# Patient Record
Sex: Male | Born: 2000 | Race: White | Hispanic: No | Marital: Single | Smoking: Never smoker
Health system: Southern US, Community
[De-identification: ages and names within clinical notes are randomized; demographics above are authoritative.]

## PROBLEM LIST (undated history)

## (undated) DIAGNOSIS — J45909 Unspecified asthma, uncomplicated: Secondary | ICD-10-CM

---
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
## 2019-01-11 IMAGING — CT CT ABD-PELV W/ CM
2 of 4 series · 16 of 46 positions shown, 18 images · IV contrast (iopamidol)
Comparison: None.

CLINICAL DATA: Five day history of right-sided abdominal pain

EXAM:
CT ABDOMEN AND PELVIS WITH CONTRAST
TECHNIQUE: Multidetector CT imaging of the abdomen and pelvis was performed
using the standard protocol following bolus administration of
intravenous contrast.
CONTRAST:  100mL E8YNIK-HOO IOPAMIDOL (E8YNIK-HOO) INJECTION 61%

[Series 2: routine abd/pel with · axial · 0.70mm/px · z∈[-863,-478]mm · 13 of 85 slices shown, 15 images]
[im 4/85  soft-tissue]
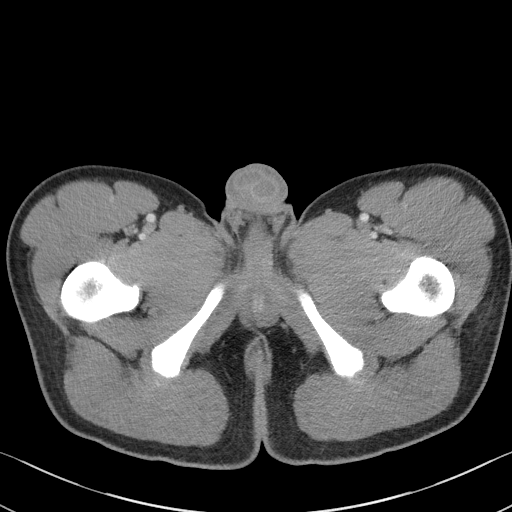
[im 4/85  bone]
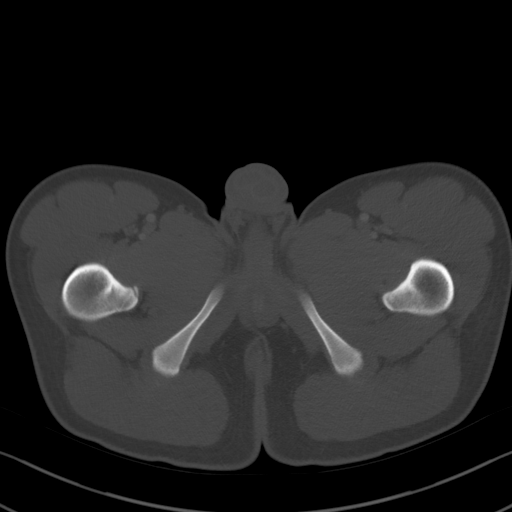
[im 11/85  soft-tissue]
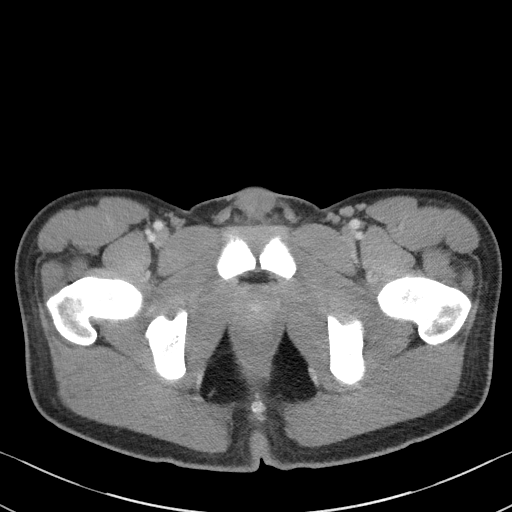
[im 17/85  soft-tissue]
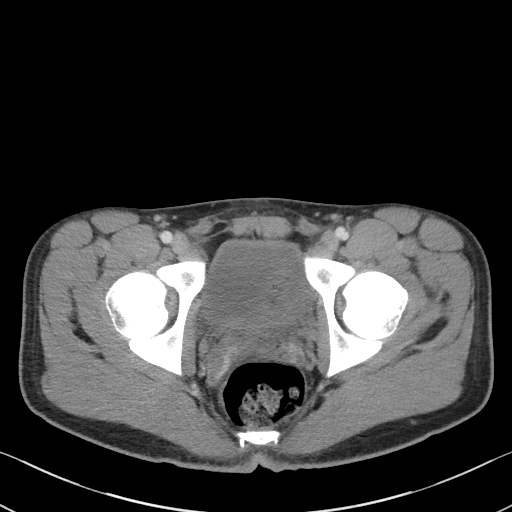
[im 24/85  soft-tissue]
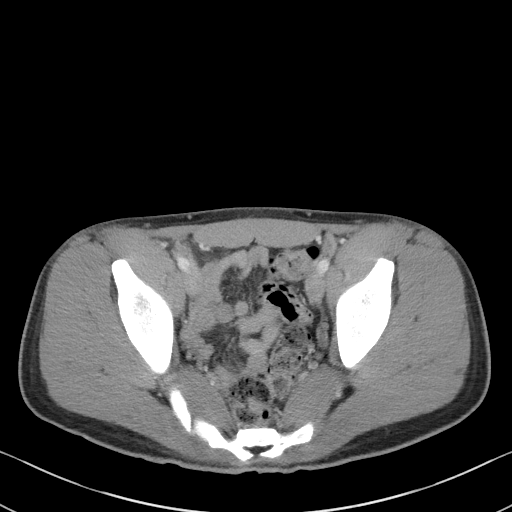
[im 31/85  soft-tissue]
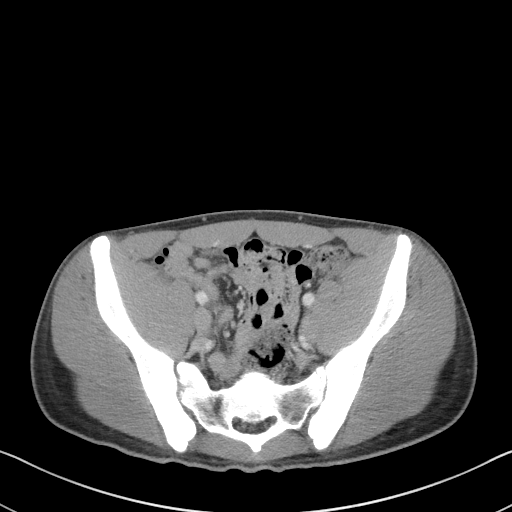
[im 37/85  soft-tissue]
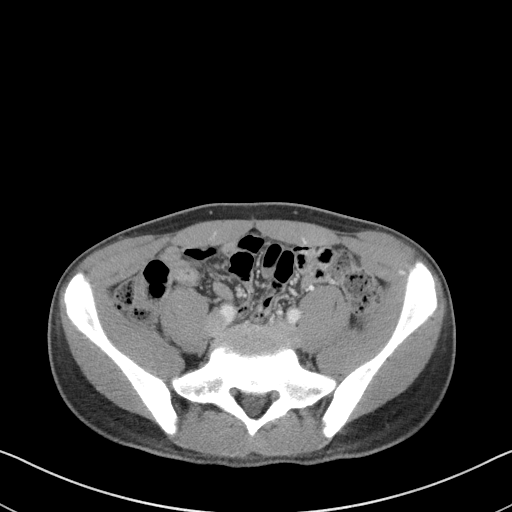
[im 44/85  soft-tissue]
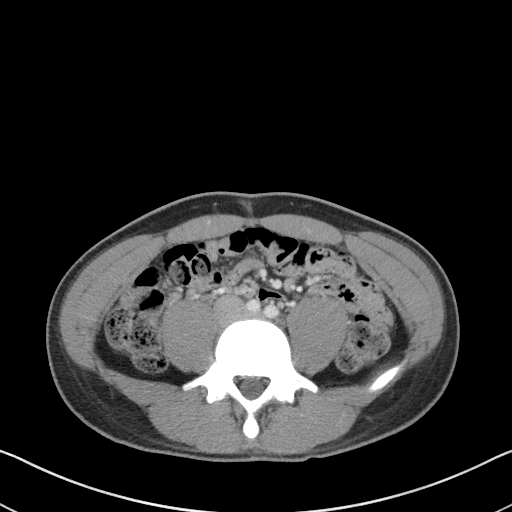
[im 48/85  soft-tissue]
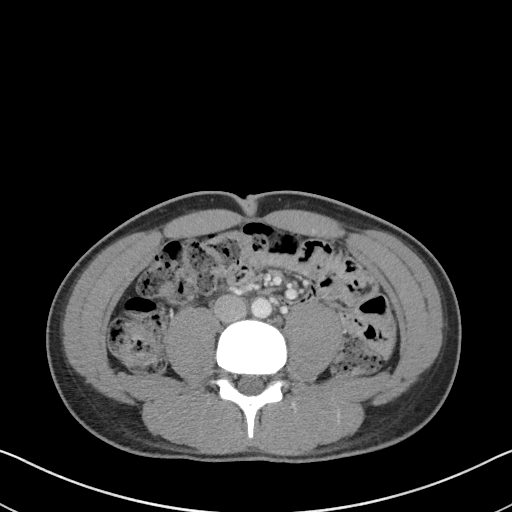
[im 54/85  soft-tissue]
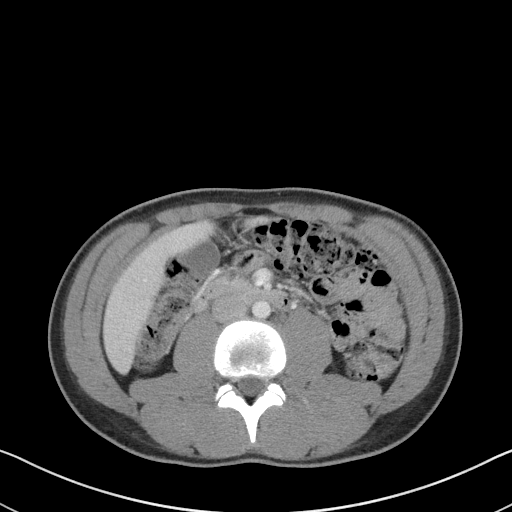
[im 54/85  bone]
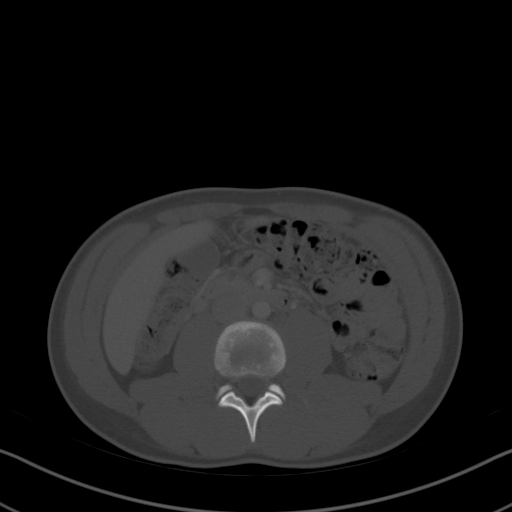
[im 61/85  soft-tissue]
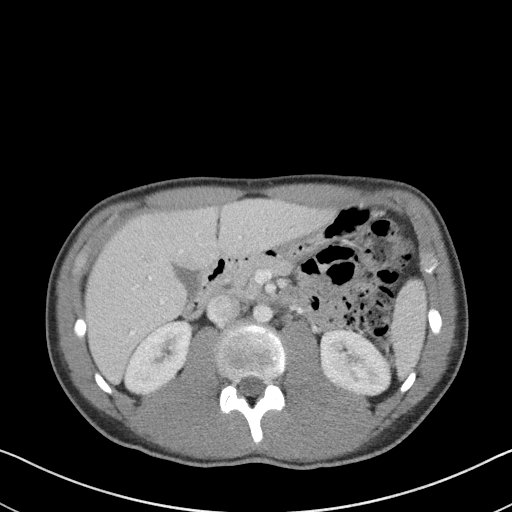
[im 68/85  soft-tissue]
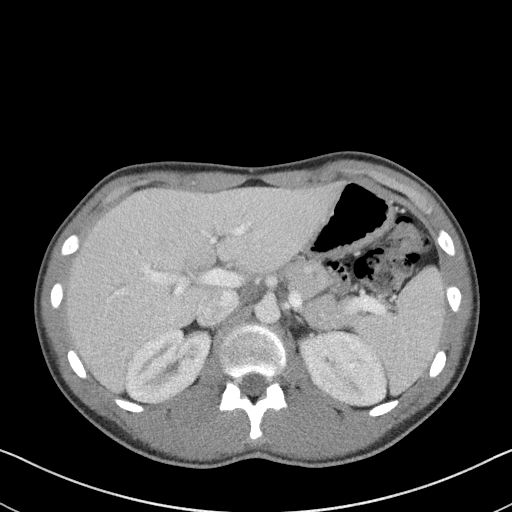
[im 74/85  soft-tissue]
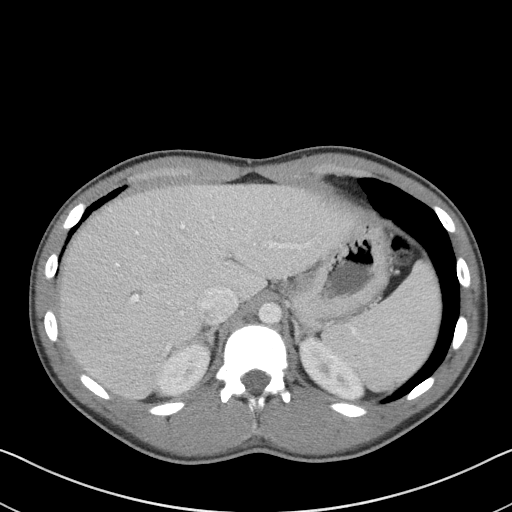
[im 81/85  soft-tissue]
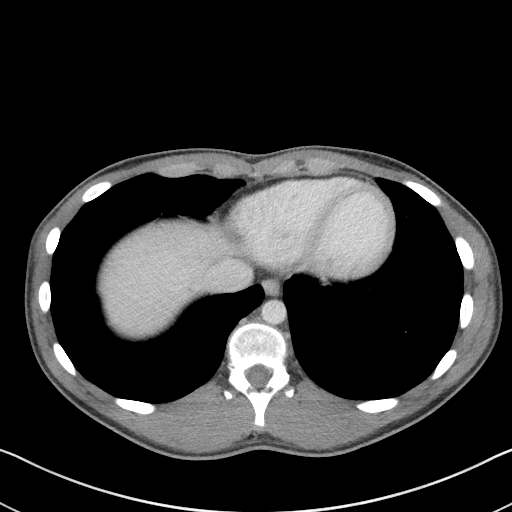

[Series 5: coronal st · coronal · 0.65mm/px · 3 of 78 slices shown]
[im 26/78  soft-tissue]
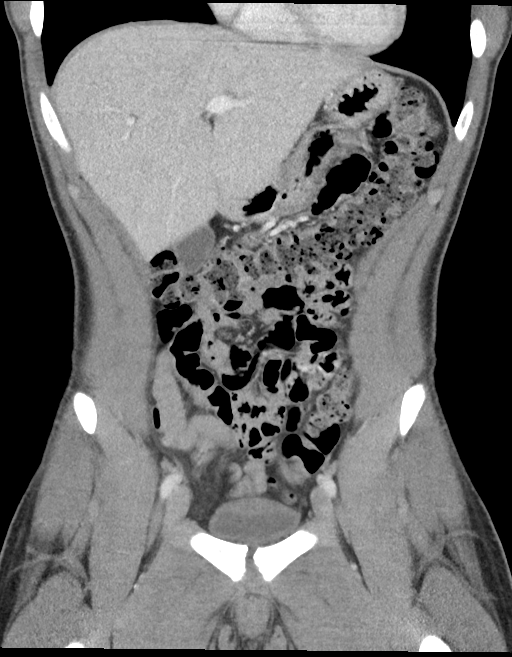
[im 35/78  soft-tissue]
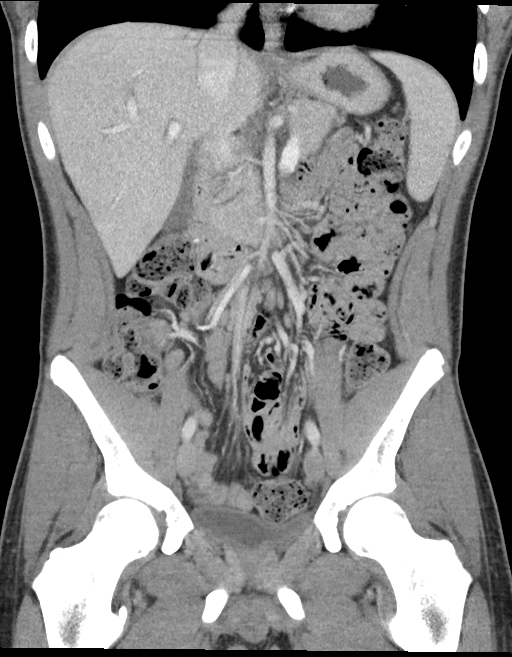
[im 43/78  soft-tissue]
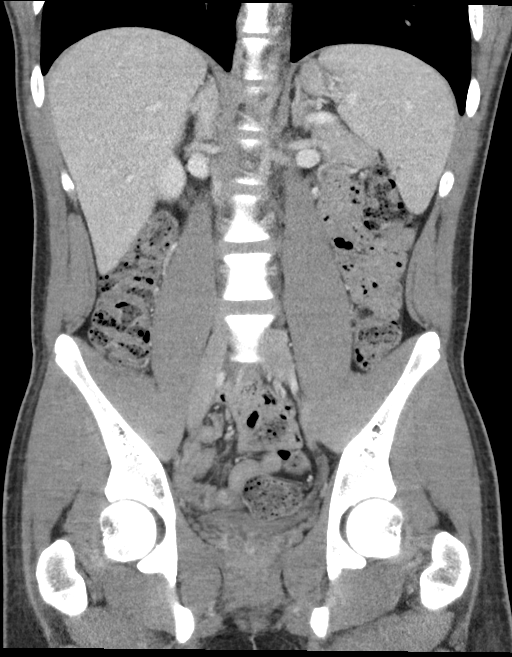

[16 of 46 positions shown; findings below may reference images not displayed]

FINDINGS: Lower chest: Lung bases are clear.

Hepatobiliary: There is slight periportal edema. No focal liver
lesions are evident. Gallbladder wall is not appreciably thickened.
There is no biliary duct dilatation.

Pancreas: No pancreatic mass or inflammatory focus.

Spleen: No splenic lesions are evident.

Adrenals/Urinary Tract: Adrenals appear normal bilaterally. Kidneys
bilaterally show no evident mass or hydronephrosis on either side.
There is no evident renal or ureteral calculus on either side.
Urinary bladder is midline with borderline urinary bladder wall
thickening.

Stomach/Bowel: There is fairly diffuse stool throughout the colon.
There is no appreciable bowel wall or mesenteric thickening. No
evident bowel obstruction. No free air or portal venous air.

Vascular/Lymphatic: No abdominal aortic aneurysm. No vascular
lesions are appreciable. No adenopathy is evident in the abdomen or
pelvis.

Reproductive: Prostate and seminal vesicles appear normal in size
and configuration. No pelvic mass evident.

Other: Appendix appears unremarkable. No ascites or abscess is
evident in the abdomen or pelvis.

Musculoskeletal: There are no blastic or lytic bone lesions. There
is no intramuscular or abdominal wall lesion.
IMPRESSION: 1. Slight periportal edema without focal liver lesion. This is a
finding that potentially could indicate a degree of underlying
parenchymal liver disease. Appropriate laboratory correlation
advised in this regard.

2. Urinary bladder wall is borderline thickened. Correlation with
urinalysis advised. No hydronephrosis. No renal or ureteral
calculus.

3. Appendix appears normal. No bowel obstruction or appreciable
bowel wall thickening. Note that there is fairly diffuse stool
throughout the colon.

4.   No abscess in the abdomen or pelvis.

## 2021-03-29 ENCOUNTER — Other Ambulatory Visit: Payer: Self-pay

## 2021-03-29 ENCOUNTER — Emergency Department
Admission: EM | Admit: 2021-03-29 | Discharge: 2021-03-29 | Disposition: A | Discharge status: Home / Self Care | Payer: Medicaid Other | Attending: Emergency Medicine | Admitting: Emergency Medicine

## 2021-03-29 ENCOUNTER — Encounter: Payer: Self-pay | Admitting: Emergency Medicine

## 2021-03-29 DIAGNOSIS — J45909 Unspecified asthma, uncomplicated: Secondary | ICD-10-CM | POA: Diagnosis not present

## 2021-03-29 DIAGNOSIS — L237 Allergic contact dermatitis due to plants, except food: Secondary | ICD-10-CM | POA: Diagnosis not present

## 2021-03-29 DIAGNOSIS — R21 Rash and other nonspecific skin eruption: Secondary | ICD-10-CM | POA: Diagnosis present

## 2021-03-29 DIAGNOSIS — L247 Irritant contact dermatitis due to plants, except food: Secondary | ICD-10-CM

## 2021-03-29 MED ORDER — IBUPROFEN 600 MG PO TABS: 600.0000 mg | ORAL_TABLET | Freq: Three times a day (TID) | ORAL | 0 refills | Status: AC | PRN

## 2021-03-29 MED ORDER — PREDNISONE 10 MG (21) PO TBPK: ORAL_TABLET | ORAL | 0 refills | Status: AC

## 2021-03-29 MED ORDER — DOXYCYCLINE HYCLATE 100 MG PO CAPS
100.0000 mg | ORAL_CAPSULE | Freq: Two times a day (BID) | ORAL | 0 refills | Status: AC
Start: 2021-03-29 — End: 2021-04-05

## 2021-03-29 NOTE — Discharge Instructions (Signed)
For your rash:  Keep the area clean and dry After washing, let the area dry COMPLETELY Afterwards, cover the open areas with DESITIN/DIAPER CREAM or antibiotic OINTMENT Do this at least twice a day until healed No work until Monday Take the antibiotics and steroids as prescribed

## 2021-03-29 NOTE — ED Triage Notes (Signed)
Pt comes into the ED via POV c/o rash to his testicles and arms.  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  Pt able to ambulate to the room with no difficulty noted.  Pt was seen at [facility] clinic on WEdnesday and was given a steroid injection.

## 2021-03-29 NOTE — ED Provider Notes (Signed)
[facility] Emergency Department Provider Note  ____________________________________________   Event Date/Time   First MD Initiated Contact with Patient 03/29/21 [phone]     (approximate)  I have reviewed the triage vital signs and the nursing notes.   HISTORY  Chief Complaint Rash    HPI Jonathan Maxwell is a 20 y.o. male  Here with rash.    Patient states that the rash began approximately 2 weeks ago.  He first noticed that it was on his [facility] fossa's bilaterally.  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  He then began to notice a mild scrotal itching as well.  He went to urgent care and was given a shot of steroids which initially improved, but he did not take the oral steroids were prescribed.  Since then, he has had worsening sores, itching, and now painful lesions on his [facility] fossa's as well as his scrotum.  He has had no drainage.  No fevers.  No dysuria.  Patient has a history of similar reactions to sumac in the past.  Denies known STD or high risk sexual activity.  No other complaints.  No specific alleviating factors.  He has been applying Desitin to the area and his scrotum with some mild transient improvement.       Past Medical History:  Diagnosis Date  . Asthma     There are no problems to display for this patient.   History reviewed. No pertinent surgical history.  Prior to Admission medications   Medication Sig Start Date End Date Taking? Authorizing Provider  doxycycline (VIBRAMYCIN) 100 MG capsule Take 1 capsule (100 mg total) by mouth 2 (two) times daily for 7 days. 03/29/21 04/05/21 Yes Shaune Pollack, MD  ibuprofen (ADVIL) 600 MG tablet Take 1 tablet (600 mg total) by mouth every 8 (eight) hours as needed for moderate pain. 03/29/21  Yes Shaune Pollack, MD  predniSONE (STERAPRED UNI-PAK 21 TAB) 10 MG (21) TBPK tablet Take as directed on packaging 03/29/21  Yes Shaune Pollack, MD   albuterol (PROVENTIL) (2.5 MG/3ML) 0.083% nebulizer solution Take 3 mLs (2.5 mg total) by nebulization every 6 (six) hours as needed for wheezing or shortness of breath. 01/06/17   Enid Derry, PA-C  azithromycin (ZITHROMAX Z-PAK) 250 MG tablet Take 2 tablets (500 mg) on  Day 1,  followed by 1 tablet (250 mg) once daily on Days 2 through 5. Patient not taking: Reported on 02/01/2018 01/06/17   Enid Derry, PA-C  magic mouthwash w/lidocaine SOLN Take 5 mLs by mouth 4 (four) times daily. 08/02/18   Cuthriell, Delorise Royals, PA-C  ondansetron (ZOFRAN ODT) 4 MG disintegrating tablet Take 1 tablet (4 mg total) by mouth every 8 (eight) hours as needed for nausea or vomiting. Patient not taking: Reported on 02/01/2018 02/07/16   Irean Hong, MD  promethazine-dextromethorphan (PROMETHAZINE-DM) 6.25-15 MG/5ML syrup Take 2.5 mLs by mouth 4 (four) times daily as needed for cough. Patient not taking: Reported on 02/01/2018 01/06/17   Enid Derry, PA-C    Allergies Patient has no known allergies.  History reviewed. No pertinent family history.  Social History Social History   Tobacco Use  . Smoking status: Never Smoker  . Smokeless tobacco: Never Used  Substance Use Topics  . Alcohol use: No  . Drug use: No    Review of Systems  Review of Systems  Constitutional: Negative for chills and fever.  HENT: Negative for  sore throat.   Respiratory: Negative for shortness of breath.   Cardiovascular: Negative for chest pain.  Gastrointestinal: Negative for abdominal pain.  Genitourinary: Negative for flank pain.  Musculoskeletal: Negative for neck pain.  Skin: Positive for rash. Negative for wound.  Allergic/Immunologic: Negative for immunocompromised state.  Neurological: Negative for weakness and numbness.  Hematological: Does not bruise/bleed easily.  All other systems reviewed and are negative.    ____________________________________________  PHYSICAL EXAM:      VITAL SIGNS: ED Triage  Vitals  Enc Vitals Group     BP 03/29/21 0634 124/78     Pulse Rate 03/29/21 0634 (!) 51     Resp 03/29/21 0634 16     Temp 03/29/21 0634 97.9 F (36.6 C)     Temp Source 03/29/21 0634 Oral     SpO2 03/29/21 0634 100 %     Weight 03/29/21 0633 160 lb (72.6 kg)     Height 03/29/21 0633 6' (1.829 m)     Head Circumference --      Peak Flow --      Pain Score 03/29/21 0633 9     Pain Loc --      Pain Edu? --      Excl. in GC? --      Physical Exam Vitals and nursing note reviewed.  Constitutional:      General: He is not in acute distress.    Appearance: He is well-developed.  HENT:     Head: Normocephalic and atraumatic.  Eyes:     Conjunctiva/sclera: Conjunctivae normal.  Cardiovascular:     Rate and Rhythm: Normal rate and regular rhythm.     Heart sounds: Normal heart sounds.  Pulmonary:     Effort: Pulmonary effort is normal. No respiratory distress.     Breath sounds: No wheezing.  Abdominal:     General: There is no distension.  Genitourinary:    Comments: Superficial abrasions/ulcerations to scrotum, in near linear distributions. No induration or fluctuance. No drainage.  Musculoskeletal:     Cervical back: Neck supple.  Skin:    General: Skin is warm.     Capillary Refill: Capillary refill takes less than 2 seconds.     Findings: No rash.     Comments: Superficial, linear, healing excoriations to the bilateral AC fossa's.  No induration.  No fluctuance.  Neurological:     Mental Status: He is alert and oriented to person, place, and time.     Motor: No abnormal muscle tone.       ____________________________________________   LABS (all labs ordered are listed, but only abnormal results are displayed)  Labs Reviewed - No data to display  ____________________________________________  EKG:  ________________________________________  RADIOLOGY All imaging, including plain films, CT scans, and ultrasounds, independently reviewed by me, and  interpretations confirmed via formal radiology reads.  ED MD interpretation:     Official radiology report(s): No results found.  ____________________________________________  PROCEDURES   Procedure(s) performed (including Critical Care):  Procedures  ____________________________________________  INITIAL IMPRESSION / MDM / ASSESSMENT AND PLAN / ED COURSE  As part of my medical decision making, I reviewed the following data within the electronic MEDICAL RECORD NUMBER Nursing notes reviewed and incorporated, Old chart reviewed, Notes from prior ED visits, and [place] Controlled Substance Database       *Jonathan Maxwell was evaluated in Emergency Department on 03/29/2021 for the symptoms described in the history of present illness. He was evaluated in the context of  the global COVID-19 pandemic, which necessitated consideration that the patient might be at risk for infection with the SARS-CoV-2 virus that causes COVID-19. Institutional protocols and algorithms that pertain to the evaluation of patients at risk for COVID-19 are in a state of rapid change based on information released by regulatory bodies including the CDC and federal and state organizations. These policies and algorithms were followed during the patient's care in the ED.  Some ED evaluations and interventions may be delayed as a result of limited staffing during the pandemic.*     Medical Decision Making:  20 yo M here with superficial rash to bilateral ACs and scrotum. Rash distribution is consistent with contact dermatitis, likely from hands pushing up his sleeves and using the restroom. There are superficial excoriations, minimal erythema and no signs of induration, fluctuance, or severe superinfection. No overt cellulitis or abscess at this time. Rash is in linear, contact distribution and is not concerning for SJS/TEN, RMSF, or other etiology. Will give longer prednisone course, as well as empiric doxy given some areas of  redness, open sores, and high risk area of involvement. Work note provided. Instructed on appropriate wound care.  ____________________________________________  FINAL CLINICAL IMPRESSION(S) / ED DIAGNOSES  Final diagnoses:  Irritant contact dermatitis due to plants, except food     MEDICATIONS GIVEN DURING THIS VISIT:  Medications - No data to display   ED Discharge Orders         Ordered    doxycycline (VIBRAMYCIN) 100 MG capsule  2 times daily        03/29/21 0706    predniSONE (STERAPRED UNI-PAK 21 TAB) 10 MG (21) TBPK tablet        03/29/21 0706    ibuprofen (ADVIL) 600 MG tablet  Every 8 hours PRN        03/29/21 0706           Note:  This document was prepared using Dragon voice recognition software and may include unintentional dictation errors.   Shaune Pollack, MD 03/29/21 [phone]

## 2021-12-31 ENCOUNTER — Other Ambulatory Visit: Payer: Self-pay

## 2021-12-31 ENCOUNTER — Ambulatory Visit: Payer: Medicaid Other | Admitting: Family Medicine

## 2021-12-31 DIAGNOSIS — Z113 Encounter for screening for infections with a predominantly sexual mode of transmission: Secondary | ICD-10-CM

## 2021-12-31 LAB — GRAM STAIN

## 2021-12-31 LAB — HM HIV SCREENING LAB: HM HIV Screening: NEGATIVE

## 2021-12-31 NOTE — Progress Notes (Signed)
[facility] Department STI clinic/screening visit  Subjective:  Jonathan Maxwell is a 21 y.o. male being seen today for an STI screening visit. The patient reports they do not have symptoms.    Patient has the following medical conditions:  There are no problems to display for this patient.    Chief Complaint  Patient presents with   SEXUALLY TRANSMITTED DISEASE    screening    HPI  Patient reports no sx, wants to be screened  Does the patient or their partner desires a pregnancy in the next year? No  Screening for MPX risk: Does the patient have an unexplained rash? No Is the patient MSM? No Does the patient endorse multiple sex partners or anonymous sex partners? No Did the patient have close or sexual contact with a person diagnosed with MPX? No Has the patient traveled outside the [place] where MPX is endemic? No Is there a high clinical suspicion for MPX-- evidenced by one of the following No  -Unlikely to be chickenpox  -Lymphadenopathy  -Rash that present in same phase of evolution on any given body part   See flowsheet for further details and programmatic requirements.    The following portions of the patient's history were reviewed and updated as appropriate: allergies, current medications, past medical history, past social history, past surgical history and problem list.  Objective:  There were no vitals filed for this visit.  Physical Exam Constitutional:      Appearance: Normal appearance.  HENT:     Head: Normocephalic and atraumatic.     Comments: No nits or hair loss    Mouth/Throat:     Mouth: Mucous membranes are moist.     Pharynx: Oropharynx is clear. No oropharyngeal exudate or posterior oropharyngeal erythema.  Pulmonary:     Effort: Pulmonary effort is normal.  Abdominal:     General: Abdomen is flat.     Palpations: Abdomen is soft. There is no hepatomegaly or mass.     Tenderness: There is no abdominal tenderness.  Genitourinary:     Pubic Area: No rash or pubic lice.      Penis: Normal.      Testes: Normal.     Epididymis:     Right: Normal.     Left: Normal.     Rectum: Normal.  Lymphadenopathy:     Head:     Right side of head: No preauricular or posterior auricular adenopathy.     Left side of head: No preauricular or posterior auricular adenopathy.     Cervical: No cervical adenopathy.     Upper Body:     Right upper body: No supraclavicular or axillary adenopathy.     Left upper body: No supraclavicular or axillary adenopathy.     Lower Body: No right inguinal adenopathy. No left inguinal adenopathy.  Skin:    General: Skin is warm and dry.     Findings: No rash.  Neurological:     Mental Status: He is alert and oriented to person, place, and time.      Assessment and Plan:  Jonathan Maxwell is a 21 y.o. male presenting to the [facility] Department for STI screening 1. Screening examination for venereal disease - Gram stain - Gonococcus culture - HIV [place] [facility] - Syphilis Serology,  Lab - Gonococcus culture - Discussed condom use.      No follow-ups on file.  No future appointments.  Federico Flake, MD

## 2021-12-31 NOTE — Progress Notes (Signed)
Patient here for STD testing. No S/S's per patient. Condoms given. Gram stain reviewed by provider. No tx per standing orders.

## 2022-01-04 LAB — GONOCOCCUS CULTURE

## 2022-01-09 ENCOUNTER — Encounter: Payer: Self-pay | Admitting: Family Medicine

## 2023-10-29 ENCOUNTER — Other Ambulatory Visit: Payer: Self-pay

## 2023-10-29 MED ORDER — SERTRALINE HCL 50 MG PO TABS
50.0000 mg | ORAL_TABLET | Freq: Every day | ORAL | 1 refills | Status: DC
  Filled 2023-10-29: qty 30, 30d supply, fill #0
  Filled 2023-11-24: qty 30, 30d supply, fill #1

## 2023-11-24 ENCOUNTER — Other Ambulatory Visit: Payer: Self-pay

## 2023-12-01 ENCOUNTER — Other Ambulatory Visit: Payer: Self-pay

## 2023-12-01 MED ORDER — SERTRALINE HCL 50 MG PO TABS
50.0000 mg | ORAL_TABLET | Freq: Every day | ORAL | 0 refills | Status: DC
  Filled 2023-12-01 – 2024-01-01 (×2): qty 30, 30d supply, fill #0

## 2023-12-15 ENCOUNTER — Other Ambulatory Visit: Payer: Self-pay

## 2024-01-01 ENCOUNTER — Other Ambulatory Visit: Payer: Self-pay

## 2024-01-04 ENCOUNTER — Other Ambulatory Visit: Payer: Self-pay

## 2024-01-20 ENCOUNTER — Other Ambulatory Visit: Payer: Self-pay

## 2024-01-20 MED ORDER — SERTRALINE HCL 50 MG PO TABS
50.0000 mg | ORAL_TABLET | Freq: Every day | ORAL | 2 refills | Status: DC
  Filled 2024-01-20 – 2024-02-02 (×2): qty 30, 30d supply, fill #0
  Filled 2024-03-09: qty 30, 30d supply, fill #1
  Filled 2024-04-13: qty 30, 30d supply, fill #2

## 2024-02-01 ENCOUNTER — Other Ambulatory Visit: Payer: Self-pay

## 2024-02-02 ENCOUNTER — Other Ambulatory Visit: Payer: Self-pay

## 2024-03-10 ENCOUNTER — Other Ambulatory Visit: Payer: Self-pay

## 2024-04-13 ENCOUNTER — Other Ambulatory Visit: Payer: Self-pay

## 2024-05-04 ENCOUNTER — Other Ambulatory Visit: Payer: Self-pay

## 2024-05-05 ENCOUNTER — Other Ambulatory Visit: Payer: Self-pay

## 2024-05-05 MED ORDER — SERTRALINE HCL 50 MG PO TABS
50.0000 mg | ORAL_TABLET | Freq: Every day | ORAL | 0 refills | Status: DC
  Filled 2024-05-05: qty 7, 7d supply, fill #0

## 2024-05-11 ENCOUNTER — Other Ambulatory Visit: Payer: Self-pay

## 2024-05-11 MED ORDER — SERTRALINE HCL 50 MG PO TABS
50.0000 mg | ORAL_TABLET | Freq: Every day | ORAL | 2 refills | Status: DC
  Filled 2024-05-11: qty 30, 30d supply, fill #0
  Filled 2024-06-11: qty 30, 30d supply, fill #1
  Filled 2024-07-13 – 2024-07-14 (×2): qty 30, 30d supply, fill #2

## 2024-06-12 ENCOUNTER — Other Ambulatory Visit: Payer: Self-pay

## 2024-06-13 ENCOUNTER — Other Ambulatory Visit: Payer: Self-pay

## 2024-07-14 ENCOUNTER — Other Ambulatory Visit: Payer: Self-pay

## 2024-08-24 ENCOUNTER — Other Ambulatory Visit: Payer: Self-pay

## 2024-08-24 MED ORDER — SERTRALINE HCL 50 MG PO TABS
50.0000 mg | ORAL_TABLET | Freq: Every day | ORAL | 2 refills | Status: AC
  Filled 2024-08-24: qty 30, 30d supply, fill #0
  Filled 2024-10-05: qty 30, 30d supply, fill #1
  Filled 2024-11-21 (×2): qty 30, 30d supply, fill #2

## 2024-10-05 ENCOUNTER — Other Ambulatory Visit: Payer: Self-pay

## 2024-10-05 ENCOUNTER — Other Ambulatory Visit (HOSPITAL_COMMUNITY): Payer: Self-pay

## 2024-11-21 ENCOUNTER — Other Ambulatory Visit: Payer: Self-pay
